# Patient Record
Sex: Female | Born: 1950
Health system: Southern US, Community
[De-identification: ages and names within clinical notes are randomized; demographics above are authoritative.]

## PROBLEM LIST (undated history)

## (undated) DIAGNOSIS — I10 Essential (primary) hypertension: Secondary | ICD-10-CM

## (undated) DIAGNOSIS — R519 Headache, unspecified: Secondary | ICD-10-CM

## (undated) DIAGNOSIS — M1990 Unspecified osteoarthritis, unspecified site: Secondary | ICD-10-CM

## (undated) DIAGNOSIS — R51 Headache: Secondary | ICD-10-CM

## (undated) HISTORY — PX: TUBAL LIGATION: SHX77

## (undated) HISTORY — PX: DILATION AND CURETTAGE OF UTERUS: SHX78

## (undated) HISTORY — PX: HERNIA REPAIR: SHX51

---

## 2015-05-24 ENCOUNTER — Other Ambulatory Visit: Payer: Self-pay | Admitting: Gastroenterology

## 2015-05-29 ENCOUNTER — Other Ambulatory Visit (HOSPITAL_COMMUNITY)
Admission: RE | Admit: 2015-05-29 | Discharge: 2015-05-29 | Disposition: A | Discharge status: Home / Self Care | Payer: BLUE CROSS/BLUE SHIELD | Source: Ambulatory Visit | Attending: Nurse Practitioner | Admitting: Nurse Practitioner

## 2015-05-29 DIAGNOSIS — Z1151 Encounter for screening for human papillomavirus (HPV): Secondary | ICD-10-CM | POA: Insufficient documentation

## 2015-05-29 DIAGNOSIS — Z01419 Encounter for gynecological examination (general) (routine) without abnormal findings: Secondary | ICD-10-CM | POA: Diagnosis present

## 2015-06-15 ENCOUNTER — Encounter (HOSPITAL_COMMUNITY): Payer: Self-pay | Admitting: *Deleted

## 2015-06-25 ENCOUNTER — Ambulatory Visit (HOSPITAL_COMMUNITY): Payer: BLUE CROSS/BLUE SHIELD | Admitting: Certified Registered"

## 2015-06-25 ENCOUNTER — Encounter (HOSPITAL_COMMUNITY): Payer: Self-pay

## 2015-06-25 ENCOUNTER — Encounter (HOSPITAL_COMMUNITY)
Admission: RE | Disposition: A | Discharge status: Home / Self Care | Payer: Self-pay | Source: Ambulatory Visit | Attending: Gastroenterology

## 2015-06-25 ENCOUNTER — Ambulatory Visit (HOSPITAL_COMMUNITY)
Admission: RE | Admit: 2015-06-25 | Discharge: 2015-06-25 | Disposition: A | Discharge status: Home / Self Care | Payer: BLUE CROSS/BLUE SHIELD | Source: Ambulatory Visit | Attending: Gastroenterology | Admitting: Gastroenterology

## 2015-06-25 DIAGNOSIS — I341 Nonrheumatic mitral (valve) prolapse: Secondary | ICD-10-CM | POA: Diagnosis not present

## 2015-06-25 DIAGNOSIS — K5730 Diverticulosis of large intestine without perforation or abscess without bleeding: Secondary | ICD-10-CM | POA: Insufficient documentation

## 2015-06-25 DIAGNOSIS — Z1211 Encounter for screening for malignant neoplasm of colon: Secondary | ICD-10-CM | POA: Diagnosis not present

## 2015-06-25 DIAGNOSIS — I10 Essential (primary) hypertension: Secondary | ICD-10-CM | POA: Diagnosis not present

## 2015-06-25 HISTORY — PX: COLONOSCOPY WITH PROPOFOL: SHX5780

## 2015-06-25 HISTORY — DX: Headache, unspecified: R51.9

## 2015-06-25 HISTORY — DX: Headache: R51

## 2015-06-25 HISTORY — DX: Essential (primary) hypertension: I10

## 2015-06-25 HISTORY — DX: Unspecified osteoarthritis, unspecified site: M19.90

## 2015-06-25 SURGERY — COLONOSCOPY WITH PROPOFOL
Anesthesia: Monitor Anesthesia Care

## 2015-06-25 MED ORDER — LIDOCAINE HCL (CARDIAC) 20 MG/ML IV SOLN
INTRAVENOUS | Status: AC
  Filled 2015-06-25: qty 5

## 2015-06-25 MED ORDER — LIDOCAINE HCL (PF) 2 % IJ SOLN
INTRAMUSCULAR | Status: DC | PRN
  Administered 2015-06-25: 20 mg via INTRADERMAL

## 2015-06-25 MED ORDER — PROPOFOL 10 MG/ML IV BOLUS
INTRAVENOUS | Status: DC | PRN
  Administered 2015-06-25: 50 mg via INTRAVENOUS
  Administered 2015-06-25 (×2): 100 mg via INTRAVENOUS

## 2015-06-25 MED ORDER — PROPOFOL 10 MG/ML IV BOLUS
INTRAVENOUS | Status: AC
  Filled 2015-06-25: qty 20

## 2015-06-25 MED ORDER — SODIUM CHLORIDE 0.9 % IV SOLN: INTRAVENOUS | Status: DC

## 2015-06-25 MED ORDER — LACTATED RINGERS IV SOLN
INTRAVENOUS | Status: DC
  Administered 2015-06-25: 1000 mL via INTRAVENOUS

## 2015-06-25 SURGICAL SUPPLY — 21 items

## 2015-06-25 NOTE — H&P (Signed)
  Procedure: Screening colonoscopy. Normal screening colonoscopy performed on 05/20/2004  History: The patient is a 65 year old female born 08/08/1950. She is scheduled to undergo a repeat screening colonoscopy today.  Past medical history: Benign microscopic hematuria. Mitral valve prolapse syndrome. Hypertension. Vitamin D deficiency. Tinnitus.  Exam: The patient is alert and lying comfortably on the endoscopy stretcher. Abdomen is soft and nontender to palpation. Lungs are clear to auscultation. Cardiac exam reveals a regular rhythm.  Plan: Proceed with screening colonoscopy

## 2015-06-25 NOTE — Transfer of Care (Signed)
Immediate Anesthesia Transfer of Care Note  Patient: Amber Dean  Procedure(s) Performed: Procedure(s): COLONOSCOPY WITH PROPOFOL (N/A)  Patient Location: PACU  Anesthesia Type:MAC  Level of Consciousness: awake, alert , oriented and patient cooperative  Airway & Oxygen Therapy: Patient Spontanous Breathing  Post-op Assessment: Report given to RN, Post -op Vital signs reviewed and stable and Patient moving all extremities X 4  Post vital signs: Reviewed and stable  Last Vitals:  Filed Vitals:   06/25/15 0702  BP: 170/69  Pulse: 83  Temp: 36.8 C  Resp: 13    Complications: No apparent anesthesia complications

## 2015-06-25 NOTE — Anesthesia Preprocedure Evaluation (Signed)
Anesthesia Evaluation  Patient identified by MRN, date of birth, ID band Patient awake    Reviewed: Allergy & Precautions, NPO status , Patient's Chart, lab work & pertinent test results  History of Anesthesia Complications Negative for: history of anesthetic complications  Airway Mallampati: II  TM Distance: >3 FB Neck ROM: Full    Dental  (+) Teeth Intact   Pulmonary neg pulmonary ROS,    breath sounds clear to auscultation       Cardiovascular hypertension, Pt. on medications (-) angina(-) Past MI and (-) CHF  Rhythm:Regular     Neuro/Psych  Headaches, neg Seizures negative psych ROS   GI/Hepatic negative GI ROS, Neg liver ROS,   Endo/Other  negative endocrine ROS  Renal/GU negative Renal ROS     Musculoskeletal   Abdominal   Peds  Hematology negative hematology ROS (+)   Anesthesia Other Findings   Reproductive/Obstetrics                             Anesthesia Physical Anesthesia Plan  ASA: II  Anesthesia Plan: MAC   Post-op Pain Management:    Induction: Intravenous  Airway Management Planned: Nasal Cannula, Natural Airway and Simple Face Mask  Additional Equipment: None  Intra-op Plan:   Post-operative Plan:   Informed Consent: I have reviewed the patients History and Physical, chart, labs and discussed the procedure including the risks, benefits and alternatives for the proposed anesthesia with the patient or authorized representative who has indicated his/her understanding and acceptance.   Dental advisory given  Plan Discussed with: CRNA  Anesthesia Plan Comments:         Anesthesia Quick Evaluation

## 2015-06-25 NOTE — Op Note (Signed)
[facility] Patient Name: Amber RavenDebra Dean Procedure Date: 06/25/2015 MRN: [id number] Attending MD: Charolett BumpersMartin K Johnson , MD Date of Birth: 03/18/1951 CSN:  Age: [age] Admit Type: Outpatient Procedure:                Colonoscopy Indications:              Screening for colorectal malignant neoplasm. Normal                            screening colonoscopy was performed on 05/20/2004. Providers:                Charolett BumpersMartin K. Johnson, MD, Priscella MannAutumn Goldsmith, RN, Dow AdolphKaren                            Hinson, RN, Harrington ChallengerHope Parker, Technician Referring MD:              Medicines:                Propofol per Anesthesia Complications:            No immediate complications. Estimated Blood Loss:     Estimated blood loss: none. Procedure:                Pre-Anesthesia Assessment:                           - Prior to the procedure, a History and Physical                            was performed, and patient medications and                            allergies were reviewed. The patient's tolerance of                            previous anesthesia was also reviewed. The risks                            and benefits of the procedure and the sedation                            options and risks were discussed with the patient.                            All questions were answered, and informed consent                            was obtained. Prior Anticoagulants: The patient has                            taken no previous anticoagulant or antiplatelet                            agents. ASA Grade Assessment: II - A patient with  mild systemic disease. After reviewing the risks                            and benefits, the patient was deemed in                            satisfactory condition to undergo the procedure.                           After obtaining informed consent, the colonoscope                            was passed under direct vision. Throughout the           procedure, the patient's blood pressure, pulse, and                            oxygen saturations were monitored continuously. The                            EC-[id number] ([id number]) scope was introduced through                            the anus and advanced to the the cecum, identified                            by appendiceal orifice and ileocecal valve. The                            colonoscopy was performed without difficulty. The                            patient tolerated the procedure well. The quality                            of the bowel preparation was good. The terminal                            ileum, the ileocecal valve, the appendiceal orifice                            and the rectum were photographed. Scope In: 8:14:37 AM Scope Out: 8:32:22 AM Scope Withdrawal Time: 0 hours 8 minutes 29 seconds  Total Procedure Duration: 0 hours 17 minutes 45 seconds  Findings:      The perianal and digital rectal examinations were normal.      Multiple small-mouthed diverticula were found in the sigmoid colon.      The entire examined colon appeared normal. The terminal ileum appeared       normal. Impression:               - Diverticulosis in the sigmoid colon.                           - The entire examined colon is normal.                           -  No specimens collected. Moderate Sedation:      N/A- Per Anesthesia Care Recommendation:           - Repeat colonoscopy in 10 years for screening                            purposes.                           - Resume previous diet.                           - Continue present medications. Procedure Code(s):        --- Professional ---                           [phone], Colonoscopy, flexible; diagnostic, including                            collection of specimen(s) by brushing or washing,                            when performed (separate procedure) Diagnosis Code(s):        --- Professional ---                            Z12.11, Encounter for screening for malignant                            neoplasm of colon                           K57.30, Diverticulosis of large intestine without                            perforation or abscess without bleeding CPT copyright 2016 [employer]. All rights reserved. The codes documented in this report are preliminary and upon coder review may  be revised to meet current compliance requirements. Danise Edge, MD Charolett Bumpers, MD 06/25/2015 8:39:04 AM This report has been signed electronically. Number of Addenda: 0

## 2015-06-25 NOTE — Anesthesia Postprocedure Evaluation (Signed)
Anesthesia Post Note  Patient: Amber RavenDebra Gioffre  Procedure(s) Performed: Procedure(s) (LRB): COLONOSCOPY WITH PROPOFOL (N/A)  Patient location during evaluation: Endoscopy Anesthesia Type: MAC Level of consciousness: awake Pain management: pain level controlled Vital Signs Assessment: post-procedure vital signs reviewed and stable Respiratory status: spontaneous breathing and respiratory function stable Cardiovascular status: stable Postop Assessment: no signs of nausea or vomiting Anesthetic complications: no    Last Vitals:  Filed Vitals:   06/25/15 0702 06/25/15 0837  BP: 170/69 112/49  Pulse: 83 73  Temp: 36.8 C 36.6 C  Resp: 13 16    Last Pain: There were no vitals filed for this visit.               Thadeus Gandolfi

## 2015-06-25 NOTE — Discharge Instructions (Addendum)
Colonoscopy, Care After °Refer to this sheet in the next few weeks. These instructions provide you with information on caring for yourself after your procedure. Your health care provider may also give you more specific instructions. Your treatment has been planned according to current medical practices, but problems sometimes occur. Call your health care provider if you have any problems or questions after your procedure. °WHAT TO EXPECT AFTER THE PROCEDURE  °After your procedure, it is typical to have the following: °· A small amount of blood in your stool. °· Moderate amounts of gas and mild abdominal cramping or bloating. °HOME CARE INSTRUCTIONS °· Do not drive, operate machinery, or sign important documents for 24 hours. °· You may shower and resume your regular physical activities, but move at a slower pace for the first 24 hours. °· Take frequent rest periods for the first 24 hours. °· Walk around or put a warm pack on your abdomen to help reduce abdominal cramping and bloating. °· Drink enough fluids to keep your urine clear or pale yellow. °· You may resume your normal diet as instructed by your health care provider. Avoid heavy or fried foods that are hard to digest. °· Avoid drinking alcohol for 24 hours or as instructed by your health care provider. °· Only take over-the-counter or prescription medicines as directed by your health care provider. °· If a tissue sample (biopsy) was taken during your procedure: °¨ Do not take aspirin or blood thinners for 7 days, or as instructed by your health care provider. °¨ Do not drink alcohol for 7 days, or as instructed by your health care provider. °¨ Eat soft foods for the first 24 hours. °SEEK MEDICAL CARE IF: °You have persistent spotting of blood in your stool 2-3 days after the procedure. °SEEK IMMEDIATE MEDICAL CARE IF: °· You have more than a small spotting of blood in your stool. °· You pass large blood clots in your stool. °· Your abdomen is swollen  (distended). °· You have nausea or vomiting. °· You have a fever. °· You have increasing abdominal pain that is not relieved with medicine. °  °This information is not intended to replace advice given to you by your health care provider. Make sure you discuss any questions you have with your health care provider. °  °Document Released: 10/23/2003 Document Revised: 12/29/2012 Document Reviewed: 11/15/2012 °Elsevier Interactive Patient Education ©2016 [employer] °Diverticulosis °Diverticulosis is the condition that develops when small pouches (diverticula) form in the wall of your colon. Your colon, or large intestine, is where water is absorbed and stool is formed. The pouches form when the inside layer of your colon pushes through weak spots in the outer layers of your colon. °CAUSES  °No one knows exactly what causes diverticulosis. °RISK FACTORS °· Being older than 50. Your risk for this condition increases with age. Diverticulosis is rare in people younger than 40 years. By age 80, almost everyone has it. °· Eating a low-fiber diet. °· Being frequently constipated. °· Being overweight. °· Not getting enough exercise. °· Smoking. °· Taking over-the-counter pain medicines, like aspirin and ibuprofen. °SYMPTOMS  °Most people with diverticulosis do not have symptoms. °DIAGNOSIS  °Because diverticulosis often has no symptoms, health care providers often discover the condition during an exam for other colon problems. In many cases, a health care provider will diagnose diverticulosis while using a flexible scope to examine the colon (colonoscopy). °TREATMENT  °If you have never developed an infection related to diverticulosis, you may not need   treatment. If you have had an infection before, treatment may include: °· Eating more fruits, vegetables, and grains. °· Taking a fiber supplement. °· Taking a live bacteria supplement (probiotic). °· Taking medicine to relax your colon. °HOME CARE INSTRUCTIONS  °· Drink at  least 6-8 glasses of water each day to prevent constipation. °· Try not to strain when you have a bowel movement. °· Keep all follow-up appointments. °If you have had an infection before:  °· Increase the fiber in your diet as directed by your health care provider or dietitian. °· Take a dietary fiber supplement if your health care provider approves. °· Only take medicines as directed by your health care provider. °SEEK MEDICAL CARE IF:  °· You have abdominal pain. °· You have bloating. °· You have cramps. °· You have not gone to the bathroom in 3 days. °SEEK IMMEDIATE MEDICAL CARE IF:  °· Your pain gets worse. °· Your bloating becomes very bad. °· You have a fever or chills, and your symptoms suddenly get worse. °· You begin vomiting. °· You have bowel movements that are bloody or black. °MAKE SURE YOU: °· Understand these instructions. °· Will watch your condition. °· Will get help right away if you are not doing well or get worse. °  °This information is not intended to replace advice given to you by your health care provider. Make sure you discuss any questions you have with your health care provider. °  °Document Released: 12/06/2003 Document Revised: 03/15/2013 Document Reviewed: 02/02/2013 °Elsevier Interactive Patient Education ©2016 [employer] Inc. ° °

## 2015-06-27 ENCOUNTER — Encounter (HOSPITAL_COMMUNITY): Payer: Self-pay | Admitting: Gastroenterology

## 2018-02-26 DIAGNOSIS — Z803 Family history of malignant neoplasm of breast: Secondary | ICD-10-CM | POA: Diagnosis not present

## 2018-02-26 DIAGNOSIS — Z1231 Encounter for screening mammogram for malignant neoplasm of breast: Secondary | ICD-10-CM | POA: Diagnosis not present

## 2018-05-03 DIAGNOSIS — E559 Vitamin D deficiency, unspecified: Secondary | ICD-10-CM | POA: Diagnosis not present

## 2018-05-03 DIAGNOSIS — I10 Essential (primary) hypertension: Secondary | ICD-10-CM | POA: Diagnosis not present

## 2018-05-03 DIAGNOSIS — Z23 Encounter for immunization: Secondary | ICD-10-CM | POA: Diagnosis not present

## 2018-05-03 DIAGNOSIS — Z0000 Encounter for general adult medical examination without abnormal findings: Secondary | ICD-10-CM | POA: Diagnosis not present

## 2018-05-03 DIAGNOSIS — Z79899 Other long term (current) drug therapy: Secondary | ICD-10-CM | POA: Diagnosis not present

## 2018-06-01 DIAGNOSIS — Z79899 Other long term (current) drug therapy: Secondary | ICD-10-CM | POA: Diagnosis not present

## 2018-06-01 DIAGNOSIS — I10 Essential (primary) hypertension: Secondary | ICD-10-CM | POA: Diagnosis not present

## 2018-06-09 DIAGNOSIS — L570 Actinic keratosis: Secondary | ICD-10-CM | POA: Diagnosis not present

## 2018-09-16 ENCOUNTER — Other Ambulatory Visit (HOSPITAL_COMMUNITY)
Admission: RE | Admit: 2018-09-16 | Discharge: 2018-09-16 | Disposition: A | Discharge status: Home / Self Care | Payer: BC Managed Care – PPO | Source: Ambulatory Visit | Attending: Nurse Practitioner | Admitting: Nurse Practitioner

## 2018-09-16 ENCOUNTER — Other Ambulatory Visit: Payer: Self-pay | Admitting: Nurse Practitioner

## 2018-09-16 DIAGNOSIS — Z01419 Encounter for gynecological examination (general) (routine) without abnormal findings: Secondary | ICD-10-CM | POA: Diagnosis not present

## 2018-09-20 LAB — CYTOLOGY - PAP
Diagnosis: NEGATIVE
HPV: NOT DETECTED

## 2018-12-23 DIAGNOSIS — Z23 Encounter for immunization: Secondary | ICD-10-CM | POA: Diagnosis not present

## 2018-12-28 DIAGNOSIS — B079 Viral wart, unspecified: Secondary | ICD-10-CM | POA: Diagnosis not present

## 2018-12-28 DIAGNOSIS — D485 Neoplasm of uncertain behavior of skin: Secondary | ICD-10-CM | POA: Diagnosis not present

## 2018-12-28 DIAGNOSIS — L814 Other melanin hyperpigmentation: Secondary | ICD-10-CM | POA: Diagnosis not present

## 2018-12-28 DIAGNOSIS — D2272 Melanocytic nevi of left lower limb, including hip: Secondary | ICD-10-CM | POA: Diagnosis not present

## 2018-12-28 DIAGNOSIS — L570 Actinic keratosis: Secondary | ICD-10-CM | POA: Diagnosis not present

## 2018-12-28 DIAGNOSIS — L821 Other seborrheic keratosis: Secondary | ICD-10-CM | POA: Diagnosis not present

## 2019-02-28 DIAGNOSIS — Z1231 Encounter for screening mammogram for malignant neoplasm of breast: Secondary | ICD-10-CM | POA: Diagnosis not present

## 2019-02-28 DIAGNOSIS — Z803 Family history of malignant neoplasm of breast: Secondary | ICD-10-CM | POA: Diagnosis not present

## 2019-04-13 ENCOUNTER — Ambulatory Visit: Payer: BC Managed Care – PPO | Attending: Internal Medicine

## 2019-04-13 DIAGNOSIS — Z23 Encounter for immunization: Secondary | ICD-10-CM | POA: Diagnosis not present

## 2019-04-13 NOTE — Progress Notes (Signed)
   Covid-19 Vaccination Clinic  Name:  Aveyah Greenwood    MRN: [id number] DOB: 13-Jun-1950  04/13/2019  Ms. Offutt was observed post Covid-19 immunization for 15 minutes without incidence. She was provided with Vaccine Information Sheet and instruction to access the V-Safe system.   Ms. Greer was instructed to call 911 with any severe reactions post vaccine: Marland Kitchen Difficulty breathing  . Swelling of your face and throat  . A fast heartbeat  . A bad rash all over your body  . Dizziness and weakness    Immunizations Administered    Name Date Dose VIS Date Route   Pfizer COVID-19 Vaccine 04/13/2019  4:21 PM 0.3 mL 03/04/2019 Intramuscular   Manufacturer: [employer], [employer]   Lot: [id number]   NDC: [address]-9

## 2019-05-03 ENCOUNTER — Ambulatory Visit: Payer: BC Managed Care – PPO | Attending: Internal Medicine

## 2019-05-03 DIAGNOSIS — Z23 Encounter for immunization: Secondary | ICD-10-CM | POA: Insufficient documentation

## 2019-05-03 NOTE — Progress Notes (Signed)
   Covid-19 Vaccination Clinic  Name:  Verlene Glantz    MRN: [id number] DOB: 09/24/1950  05/03/2019  Ms. Zarr was observed post Covid-19 immunization for 15 minutes without incidence. She was provided with Vaccine Information Sheet and instruction to access the V-Safe system.   Ms. Jaffee was instructed to call 911 with any severe reactions post vaccine: Marland Kitchen Difficulty breathing  . Swelling of your face and throat  . A fast heartbeat  . A bad rash all over your body  . Dizziness and weakness    Immunizations Administered    Name Date Dose VIS Date Route   Pfizer COVID-19 Vaccine 05/03/2019  1:48 PM 0.3 mL 03/04/2019 Intramuscular   Manufacturer: [employer], [employer]   Lot: [id number]   NDC: [address]-8

## 2019-05-11 DIAGNOSIS — I10 Essential (primary) hypertension: Secondary | ICD-10-CM | POA: Diagnosis not present

## 2019-05-11 DIAGNOSIS — Z0000 Encounter for general adult medical examination without abnormal findings: Secondary | ICD-10-CM | POA: Diagnosis not present

## 2019-05-11 DIAGNOSIS — E559 Vitamin D deficiency, unspecified: Secondary | ICD-10-CM | POA: Diagnosis not present

## 2019-05-11 DIAGNOSIS — Z79899 Other long term (current) drug therapy: Secondary | ICD-10-CM | POA: Diagnosis not present

## 2019-05-11 DIAGNOSIS — Z13220 Encounter for screening for lipoid disorders: Secondary | ICD-10-CM | POA: Diagnosis not present

## 2019-09-20 DIAGNOSIS — Z01419 Encounter for gynecological examination (general) (routine) without abnormal findings: Secondary | ICD-10-CM | POA: Diagnosis not present

## 2019-09-23 ENCOUNTER — Other Ambulatory Visit: Payer: Self-pay | Admitting: Nurse Practitioner

## 2019-09-23 DIAGNOSIS — Z13820 Encounter for screening for osteoporosis: Secondary | ICD-10-CM

## 2019-09-28 DIAGNOSIS — K5792 Diverticulitis of intestine, part unspecified, without perforation or abscess without bleeding: Secondary | ICD-10-CM | POA: Diagnosis not present

## 2019-09-28 DIAGNOSIS — R109 Unspecified abdominal pain: Secondary | ICD-10-CM | POA: Diagnosis not present

## 2019-12-13 ENCOUNTER — Ambulatory Visit
Admission: RE | Admit: 2019-12-13 | Discharge: 2019-12-13 | Disposition: A | Discharge status: Home / Self Care | Payer: Self-pay | Source: Ambulatory Visit | Attending: Nurse Practitioner | Admitting: Nurse Practitioner

## 2019-12-13 ENCOUNTER — Other Ambulatory Visit: Payer: Self-pay

## 2019-12-13 DIAGNOSIS — Z13820 Encounter for screening for osteoporosis: Secondary | ICD-10-CM

## 2019-12-13 DIAGNOSIS — Z780 Asymptomatic menopausal state: Secondary | ICD-10-CM | POA: Diagnosis not present

## 2019-12-13 DIAGNOSIS — M810 Age-related osteoporosis without current pathological fracture: Secondary | ICD-10-CM | POA: Diagnosis not present

## 2019-12-13 DIAGNOSIS — M85851 Other specified disorders of bone density and structure, right thigh: Secondary | ICD-10-CM | POA: Diagnosis not present

## 2019-12-16 DIAGNOSIS — K5792 Diverticulitis of intestine, part unspecified, without perforation or abscess without bleeding: Secondary | ICD-10-CM | POA: Diagnosis not present

## 2019-12-16 DIAGNOSIS — K9041 Non-celiac gluten sensitivity: Secondary | ICD-10-CM | POA: Diagnosis not present

## 2019-12-19 ENCOUNTER — Other Ambulatory Visit: Payer: Self-pay | Admitting: Internal Medicine

## 2019-12-19 DIAGNOSIS — Z8719 Personal history of other diseases of the digestive system: Secondary | ICD-10-CM | POA: Diagnosis not present

## 2019-12-19 DIAGNOSIS — K529 Noninfective gastroenteritis and colitis, unspecified: Secondary | ICD-10-CM

## 2019-12-19 DIAGNOSIS — R1032 Left lower quadrant pain: Secondary | ICD-10-CM

## 2019-12-20 DIAGNOSIS — K529 Noninfective gastroenteritis and colitis, unspecified: Secondary | ICD-10-CM | POA: Diagnosis not present

## 2019-12-28 DIAGNOSIS — D225 Melanocytic nevi of trunk: Secondary | ICD-10-CM | POA: Diagnosis not present

## 2019-12-28 DIAGNOSIS — D1801 Hemangioma of skin and subcutaneous tissue: Secondary | ICD-10-CM | POA: Diagnosis not present

## 2019-12-28 DIAGNOSIS — D2372 Other benign neoplasm of skin of left lower limb, including hip: Secondary | ICD-10-CM | POA: Diagnosis not present

## 2019-12-28 DIAGNOSIS — L814 Other melanin hyperpigmentation: Secondary | ICD-10-CM | POA: Diagnosis not present

## 2019-12-30 ENCOUNTER — Ambulatory Visit
Admission: RE | Admit: 2019-12-30 | Discharge: 2019-12-30 | Disposition: A | Discharge status: Home / Self Care | Payer: BC Managed Care – PPO | Source: Ambulatory Visit | Attending: Internal Medicine | Admitting: Internal Medicine

## 2019-12-30 DIAGNOSIS — D259 Leiomyoma of uterus, unspecified: Secondary | ICD-10-CM | POA: Diagnosis not present

## 2019-12-30 DIAGNOSIS — K529 Noninfective gastroenteritis and colitis, unspecified: Secondary | ICD-10-CM

## 2019-12-30 DIAGNOSIS — R630 Anorexia: Secondary | ICD-10-CM | POA: Diagnosis not present

## 2019-12-30 DIAGNOSIS — R197 Diarrhea, unspecified: Secondary | ICD-10-CM | POA: Diagnosis not present

## 2019-12-30 DIAGNOSIS — R1032 Left lower quadrant pain: Secondary | ICD-10-CM

## 2019-12-30 DIAGNOSIS — Z8719 Personal history of other diseases of the digestive system: Secondary | ICD-10-CM

## 2019-12-30 DIAGNOSIS — K5730 Diverticulosis of large intestine without perforation or abscess without bleeding: Secondary | ICD-10-CM | POA: Diagnosis not present

## 2019-12-30 MED ORDER — IOPAMIDOL (ISOVUE-300) INJECTION 61%
100.0000 mL | Freq: Once | INTRAVENOUS | Status: AC | PRN
  Administered 2019-12-30: 100 mL via INTRAVENOUS

## 2020-01-04 DIAGNOSIS — K5900 Constipation, unspecified: Secondary | ICD-10-CM | POA: Diagnosis not present

## 2020-01-04 DIAGNOSIS — R197 Diarrhea, unspecified: Secondary | ICD-10-CM | POA: Diagnosis not present

## 2020-01-04 DIAGNOSIS — K5790 Diverticulosis of intestine, part unspecified, without perforation or abscess without bleeding: Secondary | ICD-10-CM | POA: Diagnosis not present

## 2020-01-11 DIAGNOSIS — M810 Age-related osteoporosis without current pathological fracture: Secondary | ICD-10-CM | POA: Diagnosis not present

## 2020-01-11 DIAGNOSIS — K5900 Constipation, unspecified: Secondary | ICD-10-CM | POA: Diagnosis not present

## 2020-02-06 DIAGNOSIS — K5790 Diverticulosis of intestine, part unspecified, without perforation or abscess without bleeding: Secondary | ICD-10-CM | POA: Diagnosis not present

## 2020-02-06 DIAGNOSIS — K5900 Constipation, unspecified: Secondary | ICD-10-CM | POA: Diagnosis not present

## 2020-03-06 DIAGNOSIS — Z1231 Encounter for screening mammogram for malignant neoplasm of breast: Secondary | ICD-10-CM | POA: Diagnosis not present

## 2020-03-06 DIAGNOSIS — Z803 Family history of malignant neoplasm of breast: Secondary | ICD-10-CM | POA: Diagnosis not present

## 2020-05-23 DIAGNOSIS — Z0000 Encounter for general adult medical examination without abnormal findings: Secondary | ICD-10-CM | POA: Diagnosis not present

## 2020-05-23 DIAGNOSIS — E559 Vitamin D deficiency, unspecified: Secondary | ICD-10-CM | POA: Diagnosis not present

## 2020-05-23 DIAGNOSIS — M810 Age-related osteoporosis without current pathological fracture: Secondary | ICD-10-CM | POA: Diagnosis not present

## 2020-05-23 DIAGNOSIS — K5900 Constipation, unspecified: Secondary | ICD-10-CM | POA: Diagnosis not present

## 2020-05-23 DIAGNOSIS — I10 Essential (primary) hypertension: Secondary | ICD-10-CM | POA: Diagnosis not present

## 2020-06-22 DIAGNOSIS — I10 Essential (primary) hypertension: Secondary | ICD-10-CM | POA: Diagnosis not present

## 2020-08-01 DIAGNOSIS — Z20822 Contact with and (suspected) exposure to covid-19: Secondary | ICD-10-CM | POA: Diagnosis not present

## 2020-09-19 DIAGNOSIS — Z01419 Encounter for gynecological examination (general) (routine) without abnormal findings: Secondary | ICD-10-CM | POA: Diagnosis not present

## 2020-10-15 ENCOUNTER — Other Ambulatory Visit: Payer: Self-pay

## 2020-10-15 ENCOUNTER — Ambulatory Visit: Payer: BC Managed Care – PPO | Attending: Internal Medicine

## 2020-10-15 ENCOUNTER — Other Ambulatory Visit (HOSPITAL_BASED_OUTPATIENT_CLINIC_OR_DEPARTMENT_OTHER): Payer: Self-pay

## 2020-10-15 DIAGNOSIS — Z23 Encounter for immunization: Secondary | ICD-10-CM

## 2020-10-15 MED ORDER — COVID-19 MRNA VACC (MODERNA) 100 MCG/0.5ML IM SUSP
INTRAMUSCULAR | 0 refills | Status: AC
  Filled 2020-10-15: qty 0.25, 1d supply, fill #0

## 2020-10-15 NOTE — Progress Notes (Signed)
   Covid-19 Vaccination Clinic  Name:  Amber Dean    MRN: [id number] DOB: February 12, 1951  10/15/2020  [name] was observed post Covid-19 immunization for 15 minutes without incident. She was provided with Vaccine Information Sheet and instruction to access the V-Safe system.   [name] was instructed to call 911 with any severe reactions post vaccine: Difficulty breathing  Swelling of face and throat  A fast heartbeat  A bad rash all over body  Dizziness and weakness   Immunizations Administered     Name Date Dose VIS Date Route   Moderna Covid-19 Booster Vaccine 10/15/2020  1:12 PM 0.25 mL 01/11/2020 Intramuscular   Manufacturer: Moderna   Lot: [id number]   NDC: 32003-794-44

## 2020-12-28 DIAGNOSIS — L814 Other melanin hyperpigmentation: Secondary | ICD-10-CM | POA: Diagnosis not present

## 2020-12-28 DIAGNOSIS — D2271 Melanocytic nevi of right lower limb, including hip: Secondary | ICD-10-CM | POA: Diagnosis not present

## 2020-12-28 DIAGNOSIS — D1801 Hemangioma of skin and subcutaneous tissue: Secondary | ICD-10-CM | POA: Diagnosis not present

## 2020-12-28 DIAGNOSIS — D225 Melanocytic nevi of trunk: Secondary | ICD-10-CM | POA: Diagnosis not present

## 2021-03-29 DIAGNOSIS — Z1231 Encounter for screening mammogram for malignant neoplasm of breast: Secondary | ICD-10-CM | POA: Diagnosis not present

## 2021-05-27 DIAGNOSIS — M810 Age-related osteoporosis without current pathological fracture: Secondary | ICD-10-CM | POA: Diagnosis not present

## 2021-05-27 DIAGNOSIS — E538 Deficiency of other specified B group vitamins: Secondary | ICD-10-CM | POA: Diagnosis not present

## 2021-05-27 DIAGNOSIS — Z0001 Encounter for general adult medical examination with abnormal findings: Secondary | ICD-10-CM | POA: Diagnosis not present

## 2021-05-27 DIAGNOSIS — K5900 Constipation, unspecified: Secondary | ICD-10-CM | POA: Diagnosis not present

## 2021-05-27 DIAGNOSIS — K9041 Non-celiac gluten sensitivity: Secondary | ICD-10-CM | POA: Diagnosis not present

## 2021-05-27 DIAGNOSIS — I10 Essential (primary) hypertension: Secondary | ICD-10-CM | POA: Diagnosis not present

## 2021-05-27 DIAGNOSIS — E559 Vitamin D deficiency, unspecified: Secondary | ICD-10-CM | POA: Diagnosis not present

## 2021-05-30 ENCOUNTER — Other Ambulatory Visit: Payer: Self-pay | Admitting: Internal Medicine

## 2021-05-30 DIAGNOSIS — M810 Age-related osteoporosis without current pathological fracture: Secondary | ICD-10-CM

## 2021-09-19 DIAGNOSIS — R350 Frequency of micturition: Secondary | ICD-10-CM | POA: Diagnosis not present

## 2021-09-19 DIAGNOSIS — I10 Essential (primary) hypertension: Secondary | ICD-10-CM | POA: Diagnosis not present

## 2021-09-19 DIAGNOSIS — R102 Pelvic and perineal pain: Secondary | ICD-10-CM | POA: Diagnosis not present

## 2021-09-19 DIAGNOSIS — Z01419 Encounter for gynecological examination (general) (routine) without abnormal findings: Secondary | ICD-10-CM | POA: Diagnosis not present

## 2021-09-23 DIAGNOSIS — R102 Pelvic and perineal pain: Secondary | ICD-10-CM | POA: Diagnosis not present

## 2021-09-23 DIAGNOSIS — R21 Rash and other nonspecific skin eruption: Secondary | ICD-10-CM | POA: Diagnosis not present

## 2021-09-30 DIAGNOSIS — I10 Essential (primary) hypertension: Secondary | ICD-10-CM | POA: Diagnosis not present

## 2021-09-30 DIAGNOSIS — K582 Mixed irritable bowel syndrome: Secondary | ICD-10-CM | POA: Diagnosis not present

## 2021-10-29 DIAGNOSIS — L814 Other melanin hyperpigmentation: Secondary | ICD-10-CM | POA: Diagnosis not present

## 2021-10-29 DIAGNOSIS — D225 Melanocytic nevi of trunk: Secondary | ICD-10-CM | POA: Diagnosis not present

## 2021-10-29 DIAGNOSIS — L438 Other lichen planus: Secondary | ICD-10-CM | POA: Diagnosis not present

## 2021-10-29 DIAGNOSIS — L821 Other seborrheic keratosis: Secondary | ICD-10-CM | POA: Diagnosis not present

## 2021-12-02 IMAGING — CT CT ABD-PELV W/ CM
2 of 5 series · 12 of 46 positions shown, 14 images · IV contrast (iopamidol)
Comparison: None.

CLINICAL DATA: Abdominal pain. Diarrhea. Anorexia. Diverticulosis.

EXAM:
CT ABDOMEN AND PELVIS WITH CONTRAST
TECHNIQUE: Multidetector CT imaging of the abdomen and pelvis was performed
using the standard protocol following bolus administration of
intravenous contrast.
CONTRAST:  100mL C242EG-AVV IOPAMIDOL (C242EG-AVV) INJECTION 61%

[Series 2: abd pelvis 5.00 br40 s3 axial · axial · 0.45mm/px · z∈[+1161,+1446]mm · 9 of 73 slices shown, 11 images]
[im 8/73  soft-tissue]
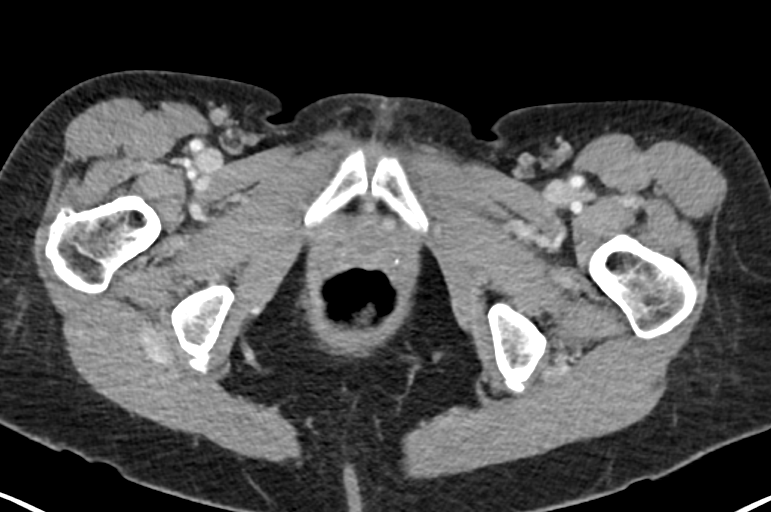
[im 8/73  bone]
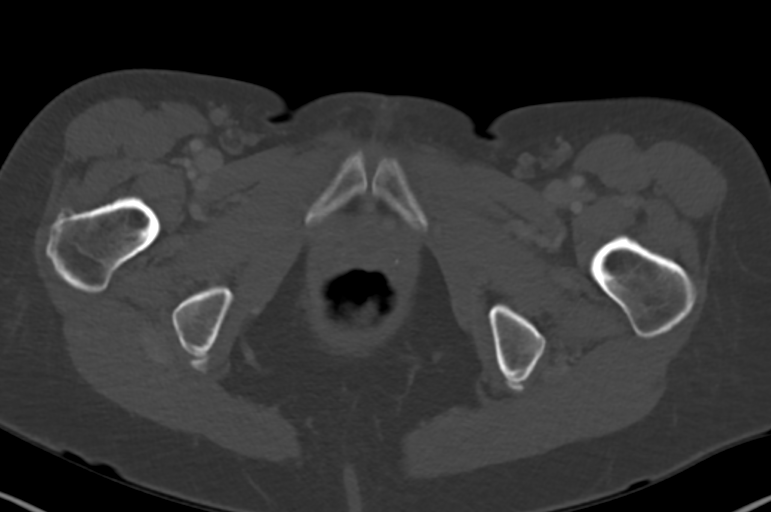
[im 15/73  soft-tissue]
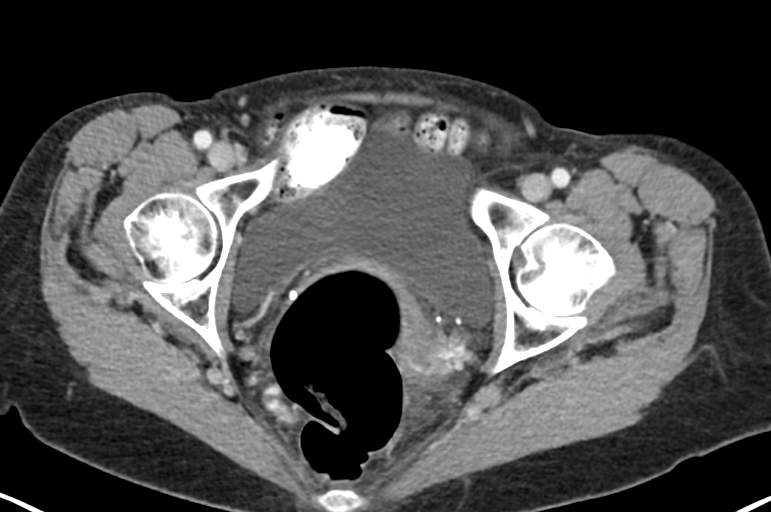
[im 22/73  soft-tissue]
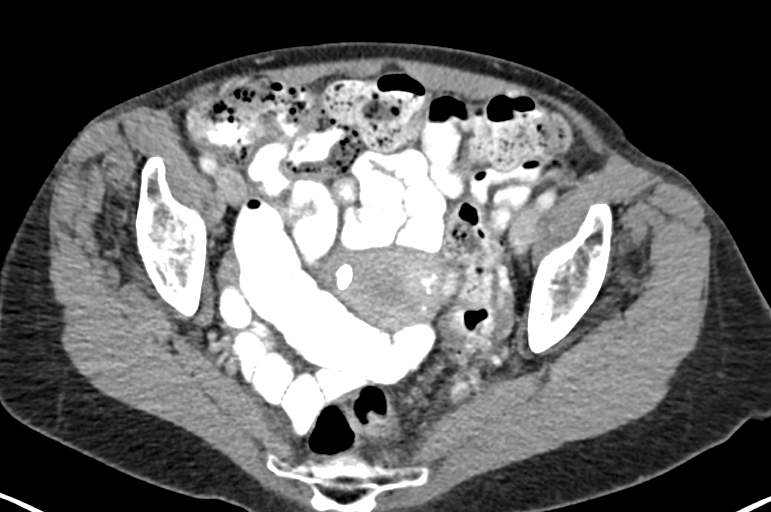
[im 29/73  soft-tissue]
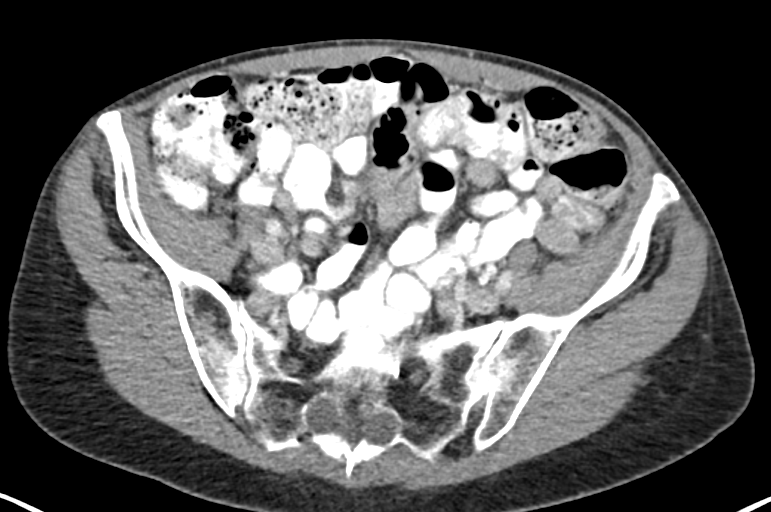
[im 37/73  soft-tissue]
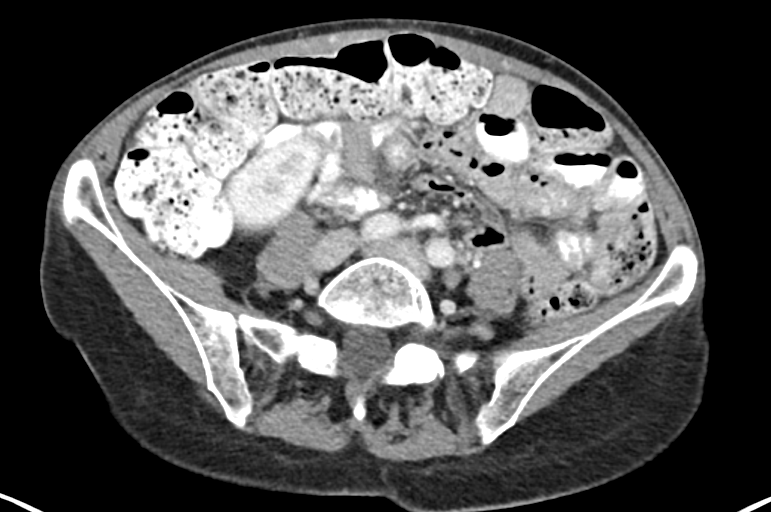
[im 44/73  soft-tissue]
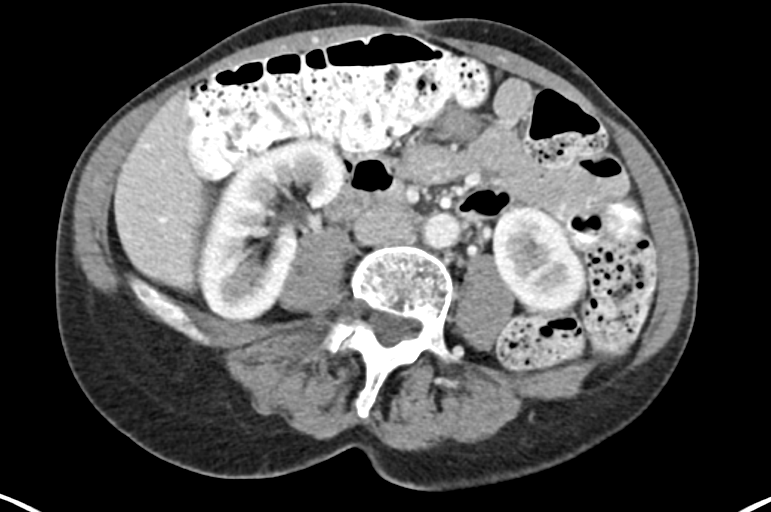
[im 51/73  soft-tissue]
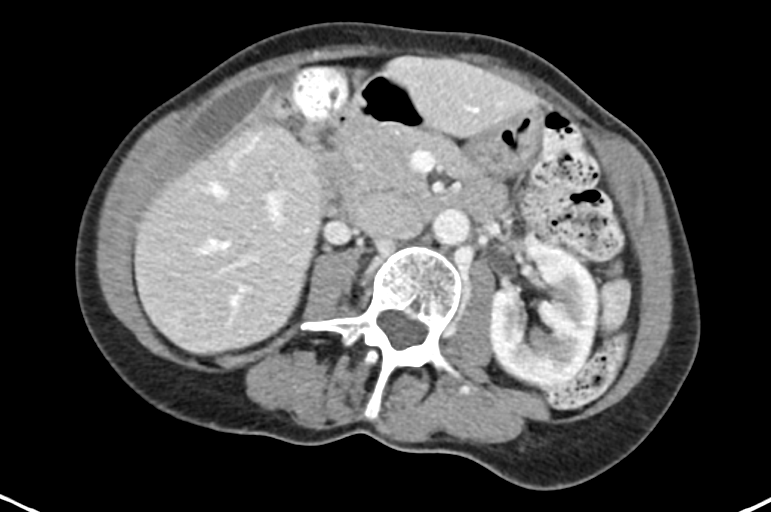
[im 58/73  soft-tissue]
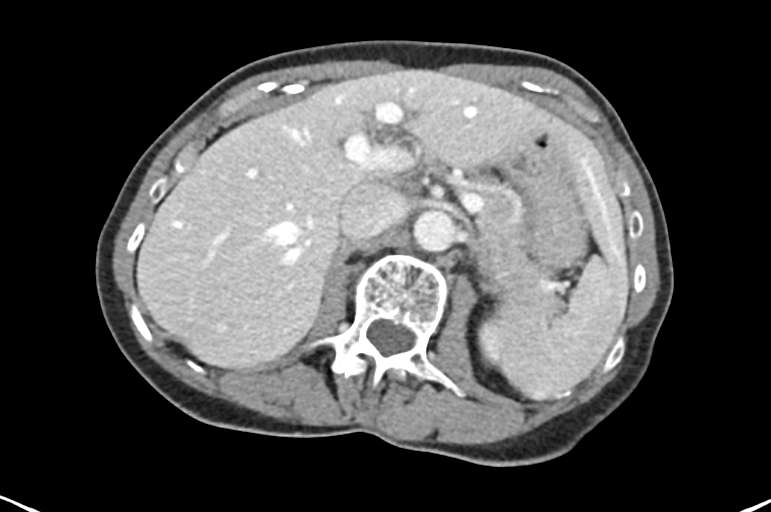
[im 65/73  soft-tissue]
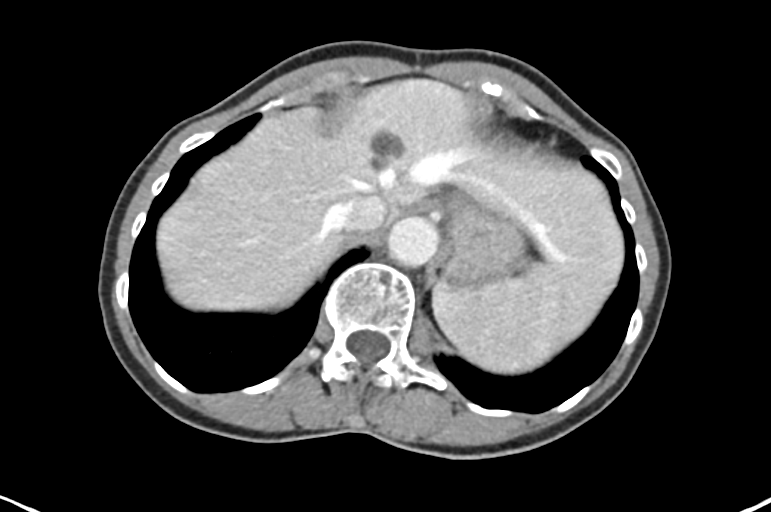
[im 65/73  bone]
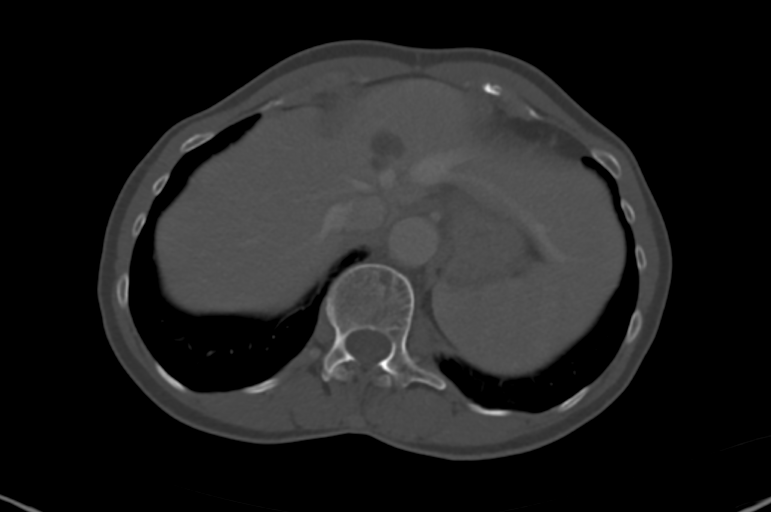

[Series 6: abd pelvis 2.00 br40 s3 cor · coronal · 0.68mm/px · 3 of 121 slices shown]
[im 41/121  soft-tissue]
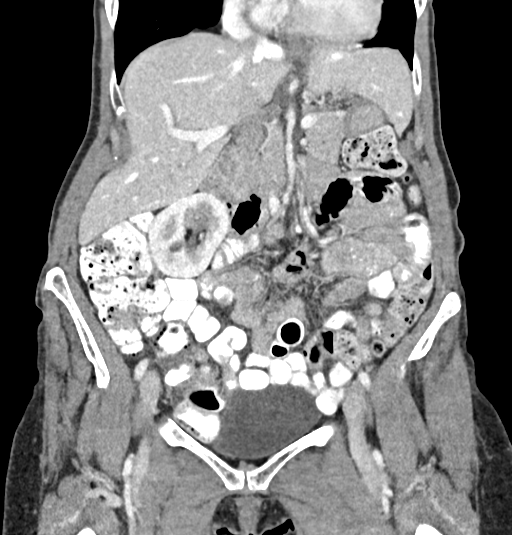
[im 54/121  soft-tissue]
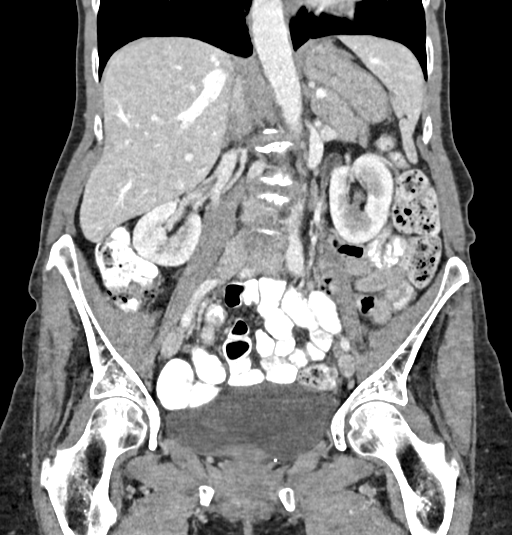
[im 67/121  soft-tissue]
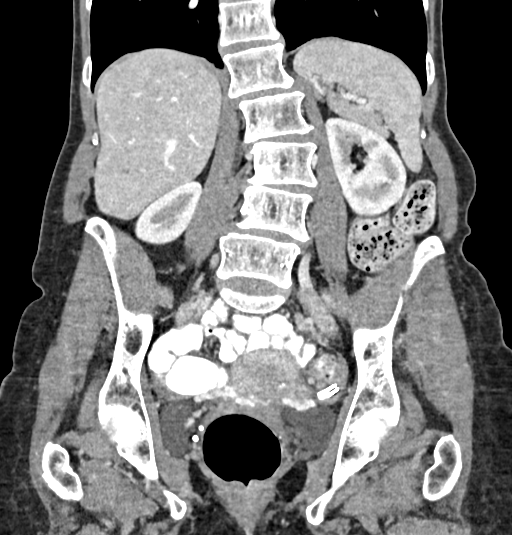

[12 of 46 positions shown; findings below may reference images not displayed]

FINDINGS: Lower Chest: No acute findings.

Hepatobiliary: No hepatic masses identified. A 1.8 cm cyst is seen
in the dome of the left lobe. A few other tiny sub-cm lesions in
right lobe are too small to characterize but most likely represent
tiny cysts. Gallbladder is unremarkable. No evidence of biliary
ductal dilatation.

Pancreas:  No mass or inflammatory changes.

Spleen: Within normal limits in size and appearance.

Adrenals/Urinary Tract: No masses identified. Tiny right renal cyst
noted. No evidence of ureteral calculi or hydronephrosis.

Stomach/Bowel: No evidence of obstruction, inflammatory process or
abnormal fluid collections. Diverticulosis is seen mainly involving
the sigmoid colon, however there is no evidence of diverticulitis.
Large stool burden seen throughout the colon.

Vascular/Lymphatic: No pathologically enlarged lymph nodes. No
abdominal aortic aneurysm.

Reproductive: A few partially calcified uterine fibroids are seen,
largest measuring 2.4 cm. Bilateral tubal ligation clips noted.
Adnexal regions are unremarkable.

Other:  None.

Musculoskeletal:  No suspicious bone lesions identified.
IMPRESSION: Colonic diverticulosis, without radiographic evidence of
diverticulitis or other acute findings.

Large stool burden noted; recommend clinical correlation for
possible constipation.

Small uterine fibroids.

## 2021-12-13 ENCOUNTER — Other Ambulatory Visit: Payer: BC Managed Care – PPO

## 2021-12-13 DIAGNOSIS — S80861A Insect bite (nonvenomous), right lower leg, initial encounter: Secondary | ICD-10-CM | POA: Diagnosis not present

## 2021-12-17 ENCOUNTER — Ambulatory Visit
Admission: RE | Admit: 2021-12-17 | Discharge: 2021-12-17 | Disposition: A | Discharge status: Home / Self Care | Payer: BC Managed Care – PPO | Source: Ambulatory Visit | Attending: Internal Medicine | Admitting: Internal Medicine

## 2021-12-17 DIAGNOSIS — M85851 Other specified disorders of bone density and structure, right thigh: Secondary | ICD-10-CM | POA: Diagnosis not present

## 2021-12-17 DIAGNOSIS — Z780 Asymptomatic menopausal state: Secondary | ICD-10-CM | POA: Diagnosis not present

## 2021-12-17 DIAGNOSIS — M810 Age-related osteoporosis without current pathological fracture: Secondary | ICD-10-CM

## 2022-01-15 ENCOUNTER — Other Ambulatory Visit (HOSPITAL_BASED_OUTPATIENT_CLINIC_OR_DEPARTMENT_OTHER): Payer: Self-pay

## 2022-01-15 MED ORDER — COMIRNATY 30 MCG/0.3ML IM SUSY
PREFILLED_SYRINGE | INTRAMUSCULAR | 0 refills | Status: AC
  Filled 2022-01-15: qty 0.3, 1d supply, fill #0

## 2022-01-15 MED ORDER — INFLUENZA VAC A&B SA ADJ QUAD 0.5 ML IM PRSY
PREFILLED_SYRINGE | INTRAMUSCULAR | 0 refills | Status: AC
  Filled 2022-01-15: qty 0.5, 1d supply, fill #0

## 2022-05-26 ENCOUNTER — Other Ambulatory Visit: Payer: BC Managed Care – PPO

## 2022-05-27 DIAGNOSIS — Z1231 Encounter for screening mammogram for malignant neoplasm of breast: Secondary | ICD-10-CM | POA: Diagnosis not present

## 2022-05-30 DIAGNOSIS — M810 Age-related osteoporosis without current pathological fracture: Secondary | ICD-10-CM | POA: Diagnosis not present

## 2022-05-30 DIAGNOSIS — E559 Vitamin D deficiency, unspecified: Secondary | ICD-10-CM | POA: Diagnosis not present

## 2022-05-30 DIAGNOSIS — Z136 Encounter for screening for cardiovascular disorders: Secondary | ICD-10-CM | POA: Diagnosis not present

## 2022-05-30 DIAGNOSIS — I10 Essential (primary) hypertension: Secondary | ICD-10-CM | POA: Diagnosis not present

## 2022-05-30 DIAGNOSIS — Z0000 Encounter for general adult medical examination without abnormal findings: Secondary | ICD-10-CM | POA: Diagnosis not present

## 2022-07-28 DIAGNOSIS — K080 Exfoliation of teeth due to systemic causes: Secondary | ICD-10-CM | POA: Diagnosis not present

## 2022-09-24 DIAGNOSIS — Z01419 Encounter for gynecological examination (general) (routine) without abnormal findings: Secondary | ICD-10-CM | POA: Diagnosis not present

## 2022-09-24 DIAGNOSIS — S30861A Insect bite (nonvenomous) of abdominal wall, initial encounter: Secondary | ICD-10-CM | POA: Diagnosis not present

## 2022-11-12 DIAGNOSIS — D2239 Melanocytic nevi of other parts of face: Secondary | ICD-10-CM | POA: Diagnosis not present

## 2022-11-12 DIAGNOSIS — I788 Other diseases of capillaries: Secondary | ICD-10-CM | POA: Diagnosis not present

## 2022-11-12 DIAGNOSIS — L814 Other melanin hyperpigmentation: Secondary | ICD-10-CM | POA: Diagnosis not present

## 2022-11-12 DIAGNOSIS — L821 Other seborrheic keratosis: Secondary | ICD-10-CM | POA: Diagnosis not present

## 2022-12-01 DIAGNOSIS — K582 Mixed irritable bowel syndrome: Secondary | ICD-10-CM | POA: Diagnosis not present

## 2022-12-01 DIAGNOSIS — I10 Essential (primary) hypertension: Secondary | ICD-10-CM | POA: Diagnosis not present

## 2022-12-01 DIAGNOSIS — M810 Age-related osteoporosis without current pathological fracture: Secondary | ICD-10-CM | POA: Diagnosis not present

## 2023-01-16 ENCOUNTER — Other Ambulatory Visit (HOSPITAL_BASED_OUTPATIENT_CLINIC_OR_DEPARTMENT_OTHER): Payer: Self-pay

## 2023-01-16 MED ORDER — INFLUENZA VAC A&B SURF ANT ADJ 0.5 ML IM SUSY
0.5000 mL | PREFILLED_SYRINGE | Freq: Once | INTRAMUSCULAR | 0 refills | Status: AC
  Filled 2023-01-16: qty 0.5, 1d supply, fill #0

## 2023-01-16 MED ORDER — COVID-19 MRNA VAC-TRIS(PFIZER) 30 MCG/0.3ML IM SUSY
0.3000 mL | PREFILLED_SYRINGE | Freq: Once | INTRAMUSCULAR | 0 refills | Status: AC
  Filled 2023-01-16: qty 0.3, 1d supply, fill #0

## 2023-02-09 DIAGNOSIS — K080 Exfoliation of teeth due to systemic causes: Secondary | ICD-10-CM | POA: Diagnosis not present

## 2023-02-10 DIAGNOSIS — H5203 Hypermetropia, bilateral: Secondary | ICD-10-CM | POA: Diagnosis not present

## 2023-02-10 DIAGNOSIS — H524 Presbyopia: Secondary | ICD-10-CM | POA: Diagnosis not present

## 2023-02-10 DIAGNOSIS — H2513 Age-related nuclear cataract, bilateral: Secondary | ICD-10-CM | POA: Diagnosis not present

## 2023-06-02 DIAGNOSIS — K582 Mixed irritable bowel syndrome: Secondary | ICD-10-CM | POA: Diagnosis not present

## 2023-06-02 DIAGNOSIS — M810 Age-related osteoporosis without current pathological fracture: Secondary | ICD-10-CM | POA: Diagnosis not present

## 2023-06-02 DIAGNOSIS — Z0000 Encounter for general adult medical examination without abnormal findings: Secondary | ICD-10-CM | POA: Diagnosis not present

## 2023-06-02 DIAGNOSIS — I10 Essential (primary) hypertension: Secondary | ICD-10-CM | POA: Diagnosis not present

## 2023-06-02 DIAGNOSIS — Z79899 Other long term (current) drug therapy: Secondary | ICD-10-CM | POA: Diagnosis not present

## 2023-06-03 DIAGNOSIS — Z1231 Encounter for screening mammogram for malignant neoplasm of breast: Secondary | ICD-10-CM | POA: Diagnosis not present

## 2023-06-11 DIAGNOSIS — N958 Other specified menopausal and perimenopausal disorders: Secondary | ICD-10-CM | POA: Diagnosis not present

## 2023-06-11 DIAGNOSIS — N950 Postmenopausal bleeding: Secondary | ICD-10-CM | POA: Diagnosis not present

## 2023-06-17 ENCOUNTER — Other Ambulatory Visit: Payer: Self-pay

## 2023-06-17 DIAGNOSIS — N958 Other specified menopausal and perimenopausal disorders: Secondary | ICD-10-CM | POA: Diagnosis not present

## 2023-06-17 DIAGNOSIS — I10 Essential (primary) hypertension: Secondary | ICD-10-CM | POA: Diagnosis not present

## 2023-06-17 DIAGNOSIS — N950 Postmenopausal bleeding: Secondary | ICD-10-CM | POA: Diagnosis not present

## 2023-06-19 LAB — SURGICAL PATHOLOGY

## 2023-08-12 DIAGNOSIS — K080 Exfoliation of teeth due to systemic causes: Secondary | ICD-10-CM | POA: Diagnosis not present

## 2023-08-25 DIAGNOSIS — N958 Other specified menopausal and perimenopausal disorders: Secondary | ICD-10-CM | POA: Diagnosis not present

## 2023-09-20 DIAGNOSIS — N390 Urinary tract infection, site not specified: Secondary | ICD-10-CM | POA: Diagnosis not present

## 2023-10-01 DIAGNOSIS — N950 Postmenopausal bleeding: Secondary | ICD-10-CM | POA: Diagnosis not present

## 2023-10-01 DIAGNOSIS — N952 Postmenopausal atrophic vaginitis: Secondary | ICD-10-CM | POA: Diagnosis not present

## 2023-11-16 DIAGNOSIS — D225 Melanocytic nevi of trunk: Secondary | ICD-10-CM | POA: Diagnosis not present

## 2023-11-16 DIAGNOSIS — L821 Other seborrheic keratosis: Secondary | ICD-10-CM | POA: Diagnosis not present

## 2023-11-16 DIAGNOSIS — D220 Melanocytic nevi of lip: Secondary | ICD-10-CM | POA: Diagnosis not present

## 2023-11-16 DIAGNOSIS — L814 Other melanin hyperpigmentation: Secondary | ICD-10-CM | POA: Diagnosis not present

## 2024-01-15 ENCOUNTER — Other Ambulatory Visit (HOSPITAL_BASED_OUTPATIENT_CLINIC_OR_DEPARTMENT_OTHER): Payer: Self-pay

## 2024-01-15 MED ORDER — FLUZONE HIGH-DOSE 0.5 ML IM SUSY
0.5000 mL | PREFILLED_SYRINGE | Freq: Once | INTRAMUSCULAR | 0 refills | Status: AC
  Filled 2024-01-15: qty 0.5, 1d supply, fill #0

## 2024-01-15 MED ORDER — COMIRNATY 30 MCG/0.3ML IM SUSY
0.3000 mL | PREFILLED_SYRINGE | Freq: Once | INTRAMUSCULAR | 0 refills | Status: AC
  Filled 2024-01-15: qty 0.3, 1d supply, fill #0

## 2024-02-02 DIAGNOSIS — K080 Exfoliation of teeth due to systemic causes: Secondary | ICD-10-CM | POA: Diagnosis not present

## 2024-02-11 DIAGNOSIS — H5203 Hypermetropia, bilateral: Secondary | ICD-10-CM | POA: Diagnosis not present

## 2024-02-11 DIAGNOSIS — H2513 Age-related nuclear cataract, bilateral: Secondary | ICD-10-CM | POA: Diagnosis not present

## 2024-02-11 DIAGNOSIS — H524 Presbyopia: Secondary | ICD-10-CM | POA: Diagnosis not present

## 2024-02-25 DIAGNOSIS — H9313 Tinnitus, bilateral: Secondary | ICD-10-CM | POA: Diagnosis not present

## 2024-02-25 DIAGNOSIS — H903 Sensorineural hearing loss, bilateral: Secondary | ICD-10-CM | POA: Diagnosis not present
